# Patient Record
Sex: Male | Born: 1995 | Race: White | Hispanic: No | Marital: Single | Smoking: Never smoker
Health system: Southern US, Community
[De-identification: ages and names within clinical notes are randomized; demographics above are authoritative.]

## PROBLEM LIST (undated history)

## (undated) DIAGNOSIS — F909 Attention-deficit hyperactivity disorder, unspecified type: Secondary | ICD-10-CM

## (undated) HISTORY — DX: Attention-deficit hyperactivity disorder, unspecified type: F90.9

## (undated) HISTORY — PX: NO PAST SURGERIES: SHX2092

---
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
## 2015-04-26 ENCOUNTER — Emergency Department (INDEPENDENT_AMBULATORY_CARE_PROVIDER_SITE_OTHER): Payer: 59

## 2015-04-26 ENCOUNTER — Encounter (HOSPITAL_COMMUNITY): Payer: Self-pay | Admitting: Emergency Medicine

## 2015-04-26 ENCOUNTER — Emergency Department (INDEPENDENT_AMBULATORY_CARE_PROVIDER_SITE_OTHER)
Admission: EM | Admit: 2015-04-26 | Discharge: 2015-04-26 | Disposition: A | Discharge status: Home / Self Care | Payer: 59 | Source: Home / Self Care | Attending: Family Medicine | Admitting: Family Medicine

## 2015-04-26 DIAGNOSIS — S93401A Sprain of unspecified ligament of right ankle, initial encounter: Secondary | ICD-10-CM

## 2015-04-26 MED ORDER — IBUPROFEN 800 MG PO TABS: 800.0000 mg | ORAL_TABLET | Freq: Three times a day (TID) | ORAL | Status: AC

## 2015-04-26 NOTE — ED Provider Notes (Signed)
CSN: [id number]     Arrival date & time 04/26/15  1413 History   First MD Initiated Contact with Patient 04/26/15 1507     Chief Complaint  Patient presents with  . Ankle Pain   (Consider location/radiation/quality/duration/timing/severity/associated sxs/prior Treatment) Patient is a 19 y.o. male presenting with ankle pain. The history is provided by the patient and a parent.  Ankle Pain Location:  Ankle Time since incident:  3 days Injury: no   Ankle location:  R ankle Pain details:    Quality:  Sharp   Radiates to:  Does not radiate   Severity:  Mild   Onset quality:  Gradual   Progression:  Worsening Chronicity:  New Dislocation: no   Foreign body present:  No foreign bodies Prior injury to area:  No Relieved by:  None tried Associated symptoms: decreased ROM and swelling     Past Medical History  Diagnosis Date  . ADHD (attention deficit hyperactivity disorder)    Past Surgical History  Procedure Laterality Date  . No past surgeries    . Orif clavicular fracture Right 11/14/2014    Procedure: OPEN REDUCTION INTERNAL FIXATION (ORIF) RIGHT CLAVICLE FRACTURE;  Surgeon: Tarry Kos, MD;  Location: [facility] OR;  Service: Orthopedics;  Laterality: Right;   No family history on file. Social History  Substance Use Topics  . Smoking status: Never Smoker   . Smokeless tobacco: Never Used     Comment: Household smokes around him  . Alcohol Use: No    Review of Systems  Musculoskeletal: Positive for joint swelling and gait problem.  Skin: Negative.   All other systems reviewed and are negative.   Allergies  Ancef  Home Medications   Prior to Admission medications   Medication Sig Start Date End Date Taking? Authorizing Provider  cyclobenzaprine (FLEXERIL) 5 MG tablet Take 1 tablet (5 mg total) by mouth 3 (three) times daily as needed for muscle spasms. Patient not taking: Reported on 11/08/2014 05/14/14   Earley Favor, NP  HYDROcodone-acetaminophen (NORCO) 5-325 MG per  tablet Take 1-2 tablets by mouth every 6 (six) hours as needed. 11/08/14   Geoffery Lyons, MD  ibuprofen (ADVIL,MOTRIN) 800 MG tablet Take 1 tablet (800 mg total) by mouth 3 (three) times daily. 04/26/15   Linna Hoff, MD  oxyCODONE (OXY IR/ROXICODONE) 5 MG immediate release tablet Take 1-3 tablets (5-15 mg total) by mouth every 4 (four) hours as needed. 11/14/14   Naiping Donnelly Stager, MD  senna-docusate (SENOKOT S) 8.6-50 MG per tablet Take 1 tablet by mouth at bedtime as needed. 11/14/14   Tarry Kos, MD   Meds Ordered and Administered this Visit  Medications - No data to display  BP 128/64 mmHg  Pulse 77  Temp(Src) 97.5 F (36.4 C) (Oral)  Resp 16  SpO2 97% No data found.   Physical Exam  Constitutional: He is oriented to person, place, and time. He appears well-developed and well-nourished.  Musculoskeletal: He exhibits tenderness.       Right ankle: He exhibits decreased range of motion and swelling. He exhibits no deformity and normal pulse. Tenderness. Medial malleolus tenderness found. No head of 5th metatarsal and no proximal fibula tenderness found. Achilles tendon normal.       Feet:  Neurological: He is alert and oriented to person, place, and time.  Skin: Skin is warm and dry. Rash noted.  Nursing note and vitals reviewed.   ED Course  Procedures (including critical care time)  Labs Review Labs  Reviewed - No data to display  Imaging Review Dg Ankle Complete Right  04/26/2015   CLINICAL DATA:  Pain, swelling, redness of medial right ankle.  EXAM: RIGHT ANKLE - COMPLETE 3+ VIEW  COMPARISON:  08/25/2011  FINDINGS: There is no evidence of fracture, dislocation, or joint effusion. There is no evidence of arthropathy or other focal bone abnormality. Soft tissues are unremarkable.  IMPRESSION: Negative.   Electronically Signed   By: Charlett Nose M.D.   On: 04/26/2015 15:38     Visual Acuity Review  Right Eye Distance:   Left Eye Distance:   Bilateral Distance:    Right Eye  Near:   Left Eye Near:    Bilateral Near:         MDM   1. Ankle sprain, right, initial encounter        Linna Hoff, MD 04/26/15 1601

## 2015-04-26 NOTE — ED Notes (Signed)
Right ankle pain, swelling, redness to inside of ankle.  Pulses 2 plus.  Scattered wounds on feet and legs and patient reports some are poison ivy

## 2015-04-26 NOTE — Discharge Instructions (Signed)
Wear ankle support as needed for comfort, activity as tolerated. advil and ice as needed, return or see orthopedist if further problems. °

## 2017-07-07 IMAGING — DX DG ANKLE COMPLETE 3+V*R*
3 series · 3 of 3 positions shown · non-contrast
Comparison: 08/25/2011

CLINICAL DATA: Pain, swelling, redness of medial right ankle.

EXAM:
RIGHT ANKLE - COMPLETE 3+ VIEW

[ankle ap]
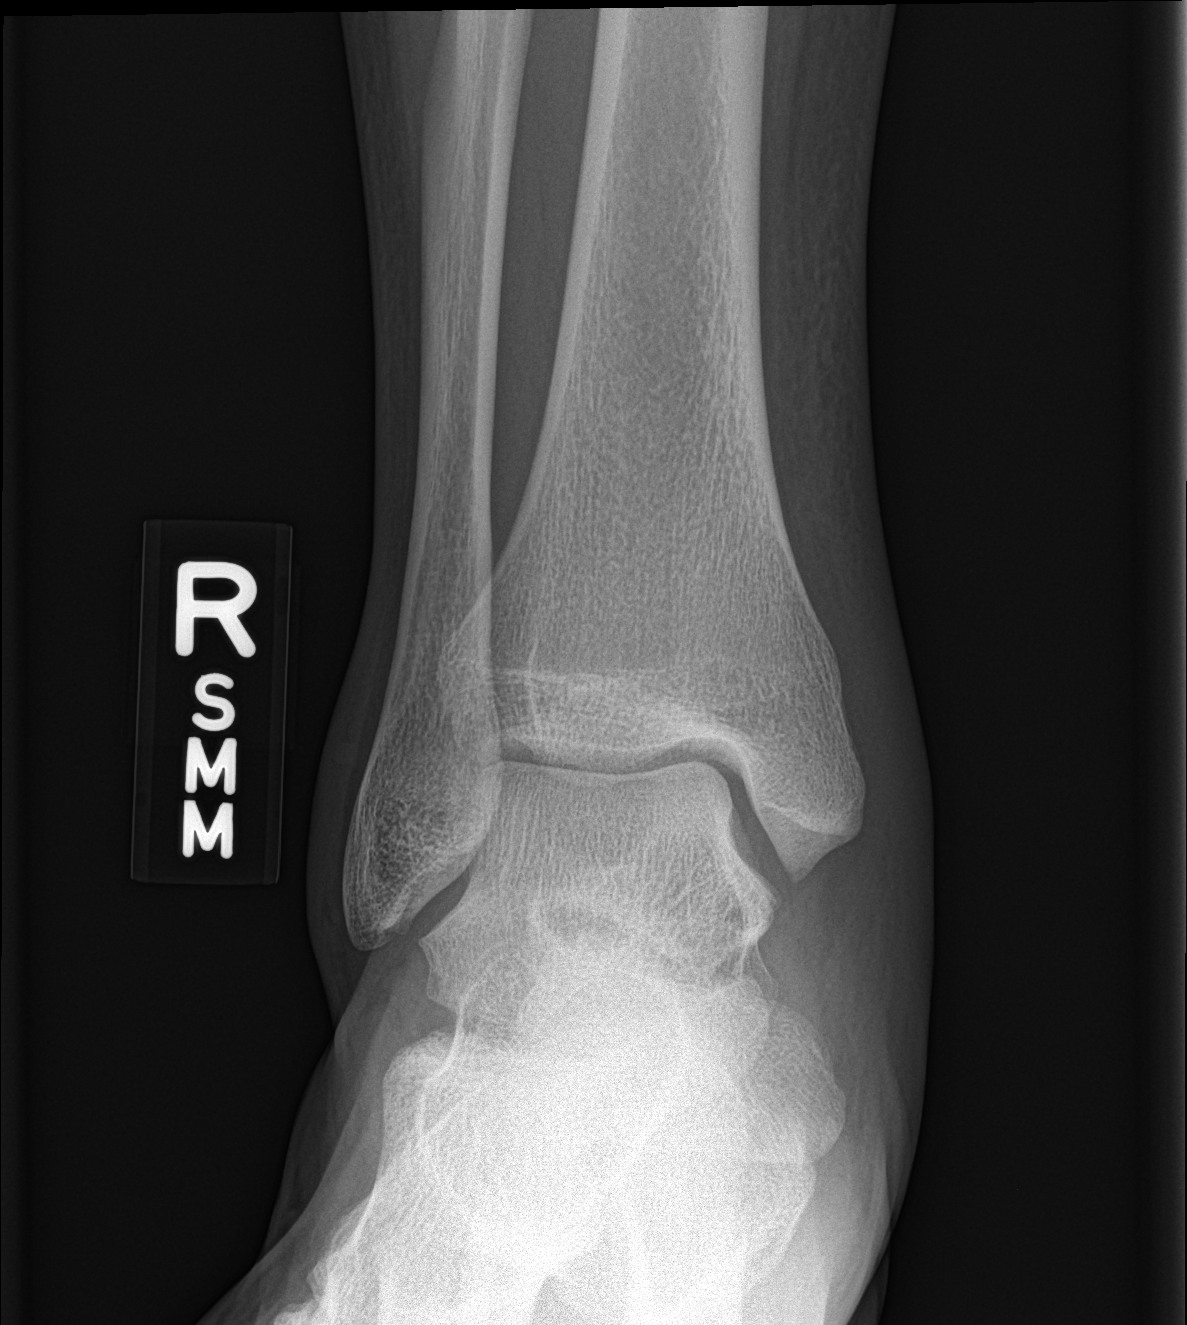

[ankle obl]
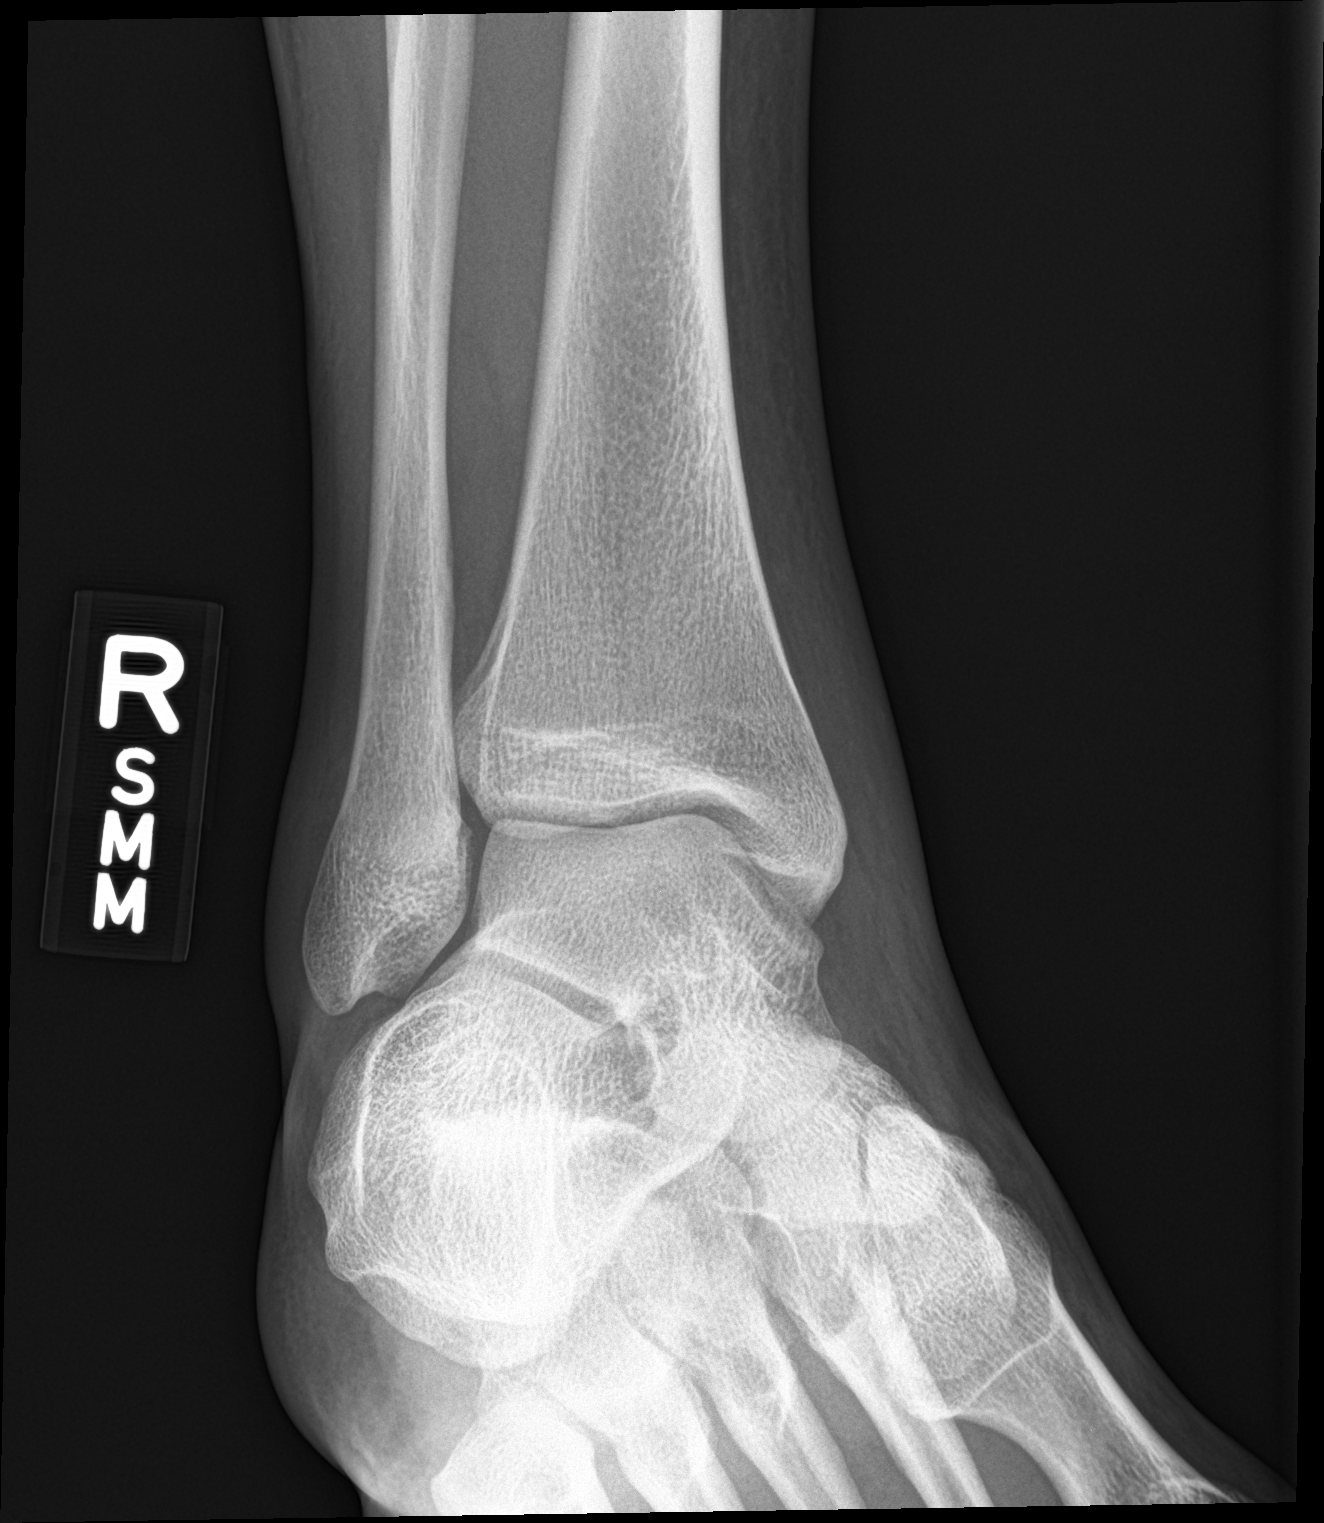

[ankle lat]
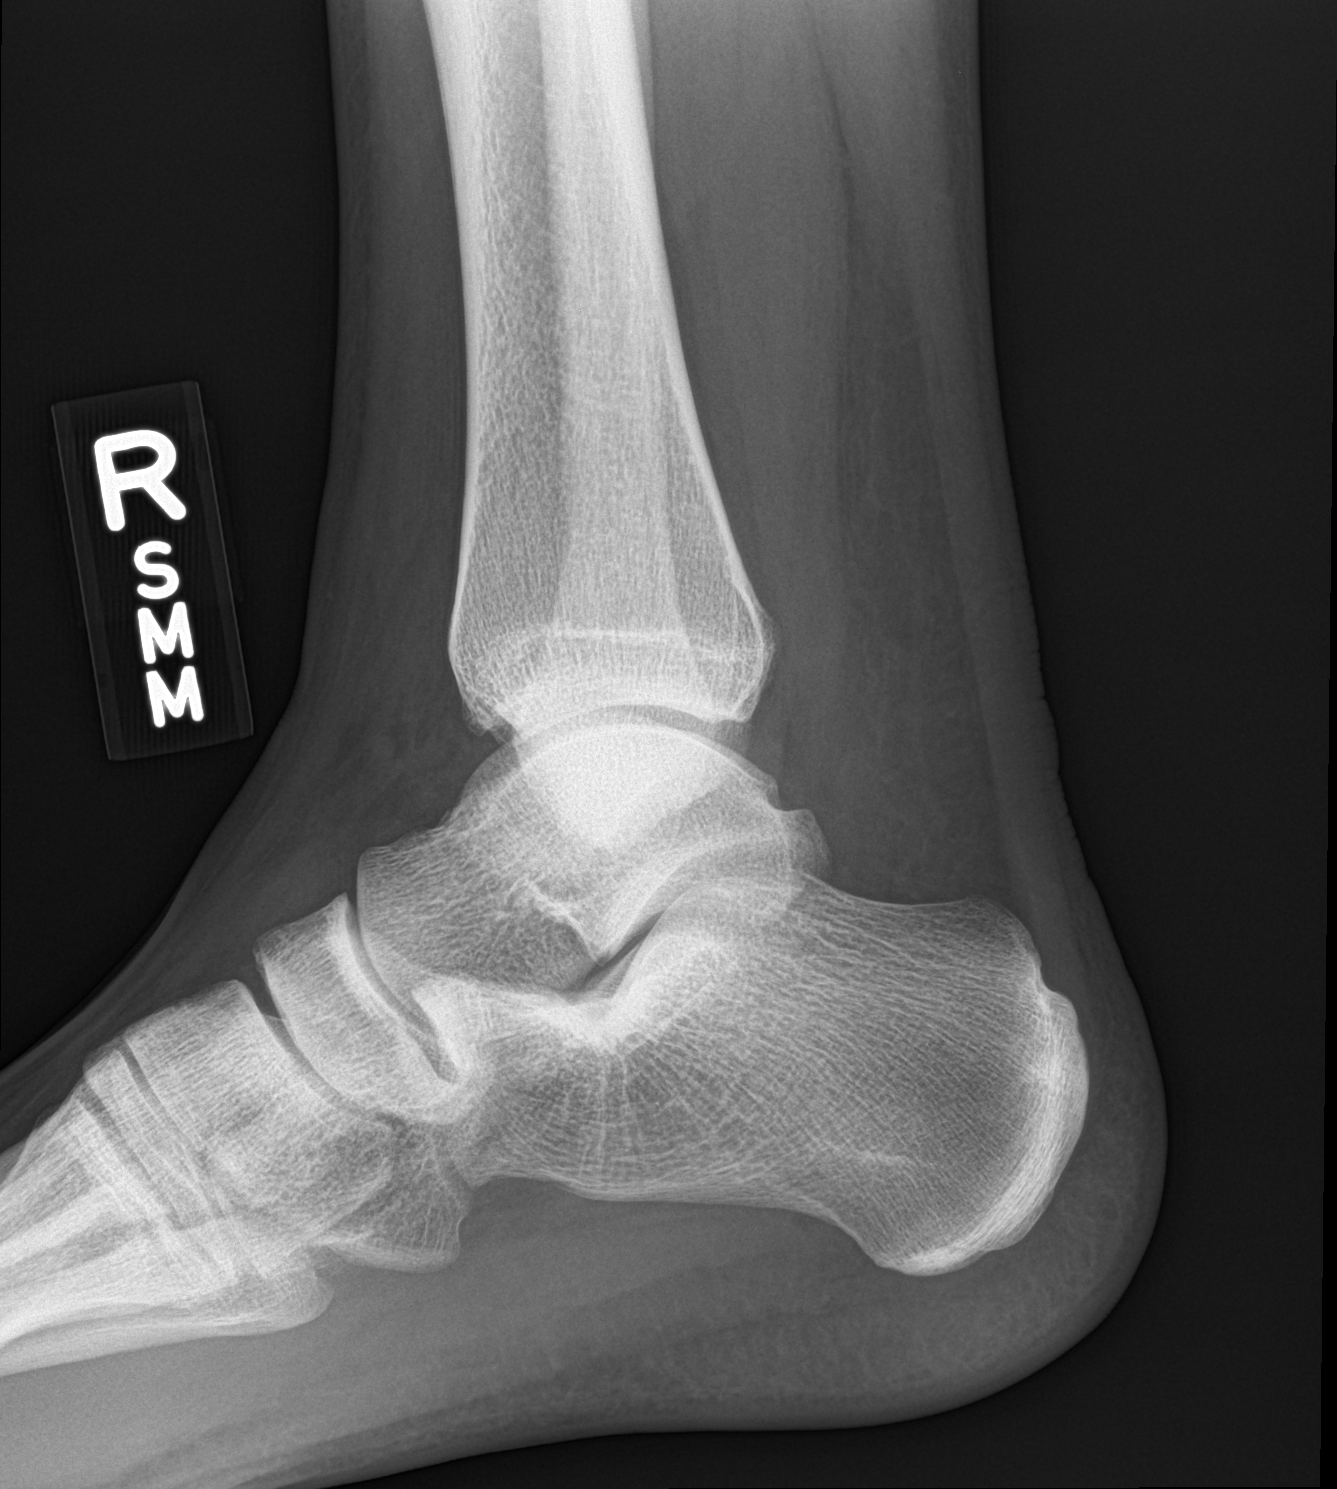

[3 of 3 positions shown; findings below may reference images not displayed]

FINDINGS: There is no evidence of fracture, dislocation, or joint effusion.
There is no evidence of arthropathy or other focal bone abnormality.
Soft tissues are unremarkable.
IMPRESSION: Negative.
